# Patient Record
Sex: Male | Born: 1955 | Race: White | Hispanic: No | Marital: Married | Smoking: Current every day smoker
Health system: Southern US, Community
[De-identification: ages and names within clinical notes are randomized; demographics above are authoritative.]

## PROBLEM LIST (undated history)

## (undated) DIAGNOSIS — I10 Essential (primary) hypertension: Secondary | ICD-10-CM

## (undated) DIAGNOSIS — E7800 Pure hypercholesterolemia, unspecified: Secondary | ICD-10-CM

## (undated) DIAGNOSIS — G8929 Other chronic pain: Secondary | ICD-10-CM

## (undated) DIAGNOSIS — G4730 Sleep apnea, unspecified: Secondary | ICD-10-CM

## (undated) DIAGNOSIS — IMO0002 Reserved for concepts with insufficient information to code with codable children: Secondary | ICD-10-CM

## (undated) DIAGNOSIS — M549 Dorsalgia, unspecified: Secondary | ICD-10-CM

## (undated) DIAGNOSIS — G629 Polyneuropathy, unspecified: Secondary | ICD-10-CM

## (undated) DIAGNOSIS — J449 Chronic obstructive pulmonary disease, unspecified: Secondary | ICD-10-CM

## (undated) DIAGNOSIS — J329 Chronic sinusitis, unspecified: Secondary | ICD-10-CM

## (undated) DIAGNOSIS — E119 Type 2 diabetes mellitus without complications: Secondary | ICD-10-CM

## (undated) DIAGNOSIS — M542 Cervicalgia: Secondary | ICD-10-CM

## (undated) DIAGNOSIS — K219 Gastro-esophageal reflux disease without esophagitis: Secondary | ICD-10-CM

## (undated) DIAGNOSIS — G5600 Carpal tunnel syndrome, unspecified upper limb: Secondary | ICD-10-CM

## (undated) DIAGNOSIS — Z9289 Personal history of other medical treatment: Secondary | ICD-10-CM

## (undated) HISTORY — PX: FOOT SURGERY: SHX648

## (undated) HISTORY — PX: HAND SURGERY: SHX662

---

## 2005-03-26 ENCOUNTER — Ambulatory Visit: Admission: RE | Admit: 2005-03-26 | Discharge: 2005-03-26 | Payer: Self-pay | Admitting: Neurosurgery

## 2005-03-30 ENCOUNTER — Ambulatory Visit: Payer: Self-pay | Admitting: Internal Medicine

## 2005-04-15 ENCOUNTER — Ambulatory Visit: Payer: Self-pay | Admitting: Internal Medicine

## 2005-11-22 ENCOUNTER — Ambulatory Visit: Payer: Self-pay | Admitting: Pulmonary Disease

## 2005-12-09 ENCOUNTER — Ambulatory Visit: Payer: Self-pay | Admitting: Pulmonary Disease

## 2005-12-27 ENCOUNTER — Ambulatory Visit: Payer: Self-pay | Admitting: Internal Medicine

## 2006-01-17 ENCOUNTER — Ambulatory Visit: Payer: Self-pay | Admitting: Pulmonary Disease

## 2006-03-01 ENCOUNTER — Ambulatory Visit: Payer: Self-pay | Admitting: Pulmonary Disease

## 2007-01-19 ENCOUNTER — Ambulatory Visit: Payer: Self-pay | Admitting: Pulmonary Disease

## 2007-02-09 ENCOUNTER — Ambulatory Visit: Payer: Self-pay | Admitting: Cardiovascular Disease

## 2007-02-16 ENCOUNTER — Ambulatory Visit: Payer: Self-pay

## 2007-05-01 ENCOUNTER — Emergency Department (HOSPITAL_COMMUNITY): Admission: EM | Admit: 2007-05-01 | Discharge: 2007-05-01 | Payer: Self-pay | Admitting: Podiatry

## 2008-06-16 ENCOUNTER — Ambulatory Visit: Payer: Self-pay | Admitting: *Deleted

## 2008-06-16 ENCOUNTER — Ambulatory Visit: Payer: Self-pay | Admitting: Cardiology

## 2008-06-16 ENCOUNTER — Inpatient Hospital Stay (HOSPITAL_COMMUNITY): Admission: EM | Admit: 2008-06-16 | Discharge: 2008-06-18 | Payer: Self-pay | Admitting: Emergency Medicine

## 2008-06-17 ENCOUNTER — Ambulatory Visit: Payer: Self-pay | Admitting: Surgery

## 2008-06-17 ENCOUNTER — Encounter (INDEPENDENT_AMBULATORY_CARE_PROVIDER_SITE_OTHER): Payer: Self-pay | Admitting: *Deleted

## 2008-06-25 ENCOUNTER — Ambulatory Visit: Payer: Self-pay | Admitting: Pulmonary Disease

## 2008-06-25 DIAGNOSIS — J438 Other emphysema: Secondary | ICD-10-CM | POA: Insufficient documentation

## 2008-06-25 DIAGNOSIS — K219 Gastro-esophageal reflux disease without esophagitis: Secondary | ICD-10-CM

## 2008-06-25 DIAGNOSIS — J4489 Other specified chronic obstructive pulmonary disease: Secondary | ICD-10-CM | POA: Insufficient documentation

## 2008-06-25 DIAGNOSIS — F17200 Nicotine dependence, unspecified, uncomplicated: Secondary | ICD-10-CM | POA: Insufficient documentation

## 2008-06-25 DIAGNOSIS — E785 Hyperlipidemia, unspecified: Secondary | ICD-10-CM | POA: Insufficient documentation

## 2008-06-25 DIAGNOSIS — J449 Chronic obstructive pulmonary disease, unspecified: Secondary | ICD-10-CM

## 2008-07-01 ENCOUNTER — Telehealth: Payer: Self-pay | Admitting: Pulmonary Disease

## 2008-07-02 ENCOUNTER — Telehealth (INDEPENDENT_AMBULATORY_CARE_PROVIDER_SITE_OTHER): Payer: Self-pay | Admitting: *Deleted

## 2008-07-15 ENCOUNTER — Ambulatory Visit (HOSPITAL_COMMUNITY): Admission: RE | Admit: 2008-07-15 | Discharge: 2008-07-15 | Payer: Self-pay | Admitting: Orthopaedic Surgery

## 2008-07-16 ENCOUNTER — Emergency Department (HOSPITAL_COMMUNITY): Admission: EM | Admit: 2008-07-16 | Discharge: 2008-07-16 | Payer: Self-pay | Admitting: Emergency Medicine

## 2008-09-15 ENCOUNTER — Emergency Department (HOSPITAL_COMMUNITY): Admission: EM | Admit: 2008-09-15 | Discharge: 2008-09-15 | Payer: Self-pay | Admitting: Emergency Medicine

## 2008-12-09 ENCOUNTER — Emergency Department (HOSPITAL_COMMUNITY): Admission: EM | Admit: 2008-12-09 | Discharge: 2008-12-09 | Payer: Self-pay | Admitting: Emergency Medicine

## 2009-03-26 ENCOUNTER — Emergency Department (HOSPITAL_COMMUNITY): Admission: EM | Admit: 2009-03-26 | Discharge: 2009-03-26 | Payer: Self-pay | Admitting: Emergency Medicine

## 2009-05-26 IMAGING — CR DG CHEST 2V
2 series · 2 of 2 positions shown · non-contrast
Comparison: PA and lateral chest 07/15/2008 and 01/19/2007.

CLINICAL DATA: Left side chest pain.  Shortness of breath.

CHEST - 2 VIEW

[view not recorded (1 of 2)]
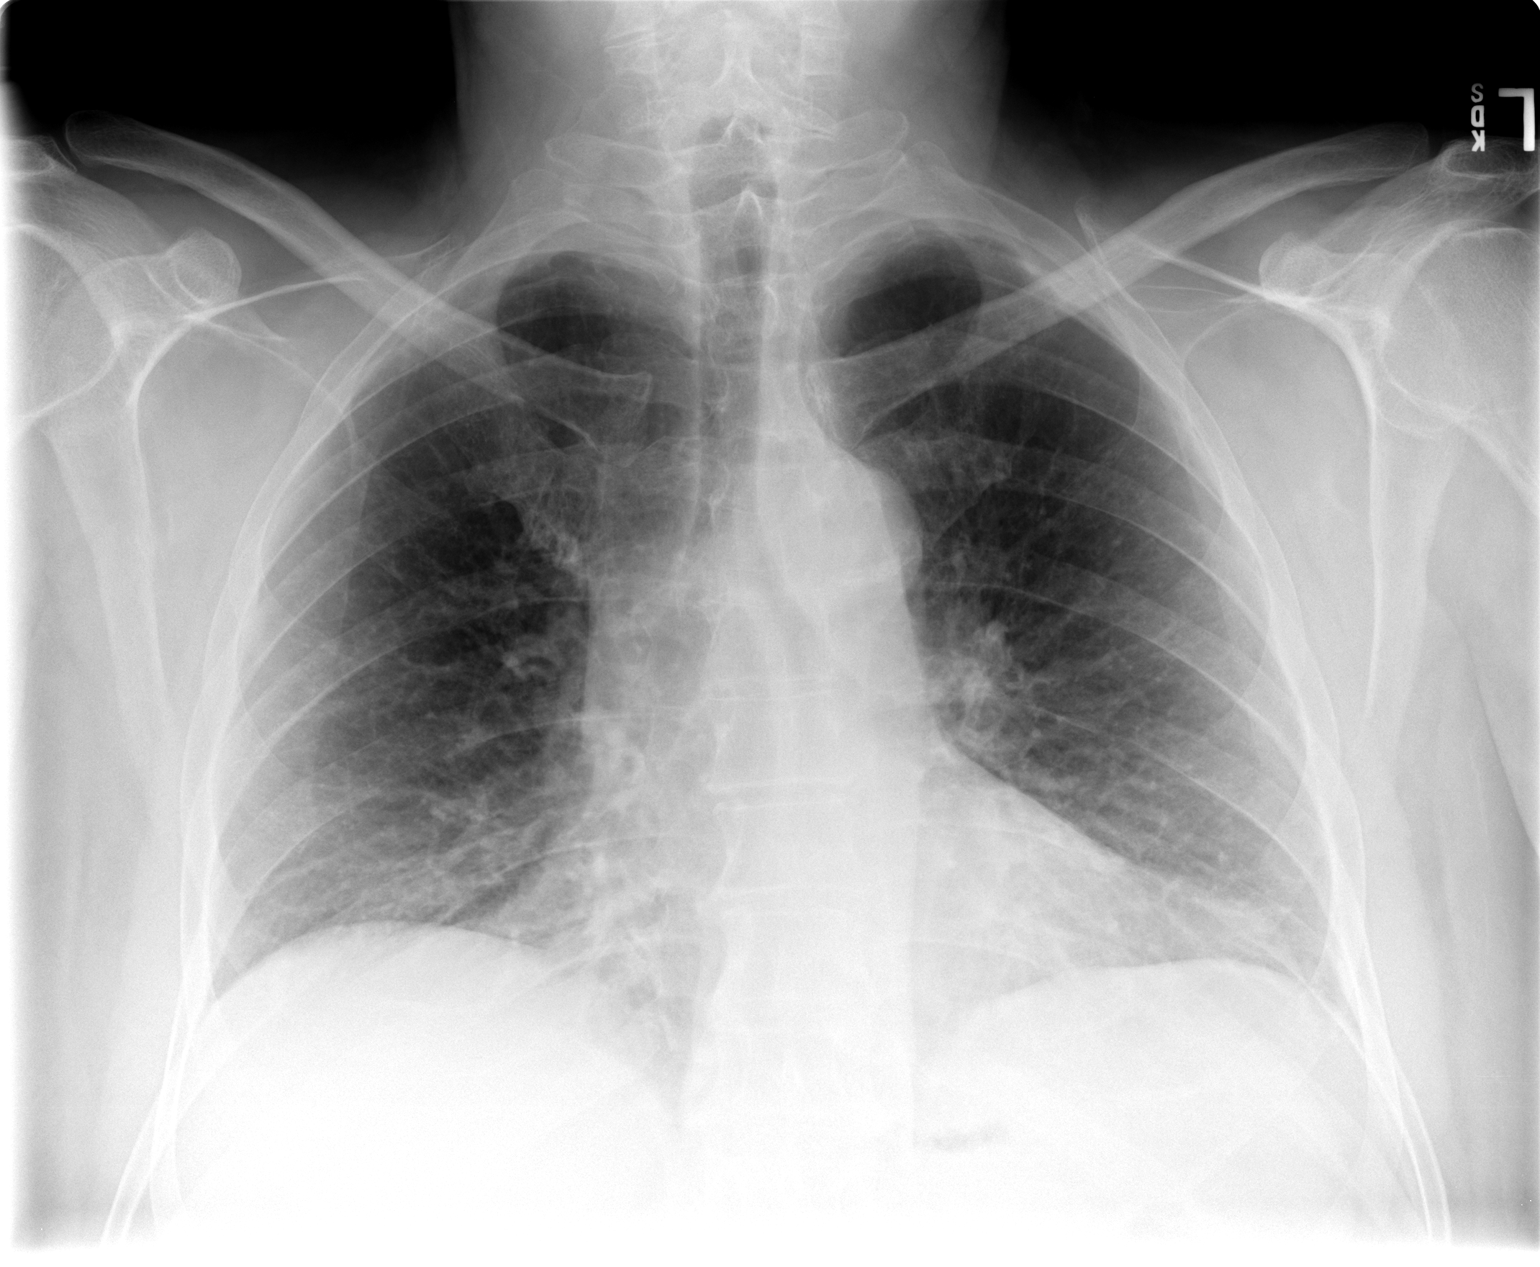

[view not recorded (2 of 2)]
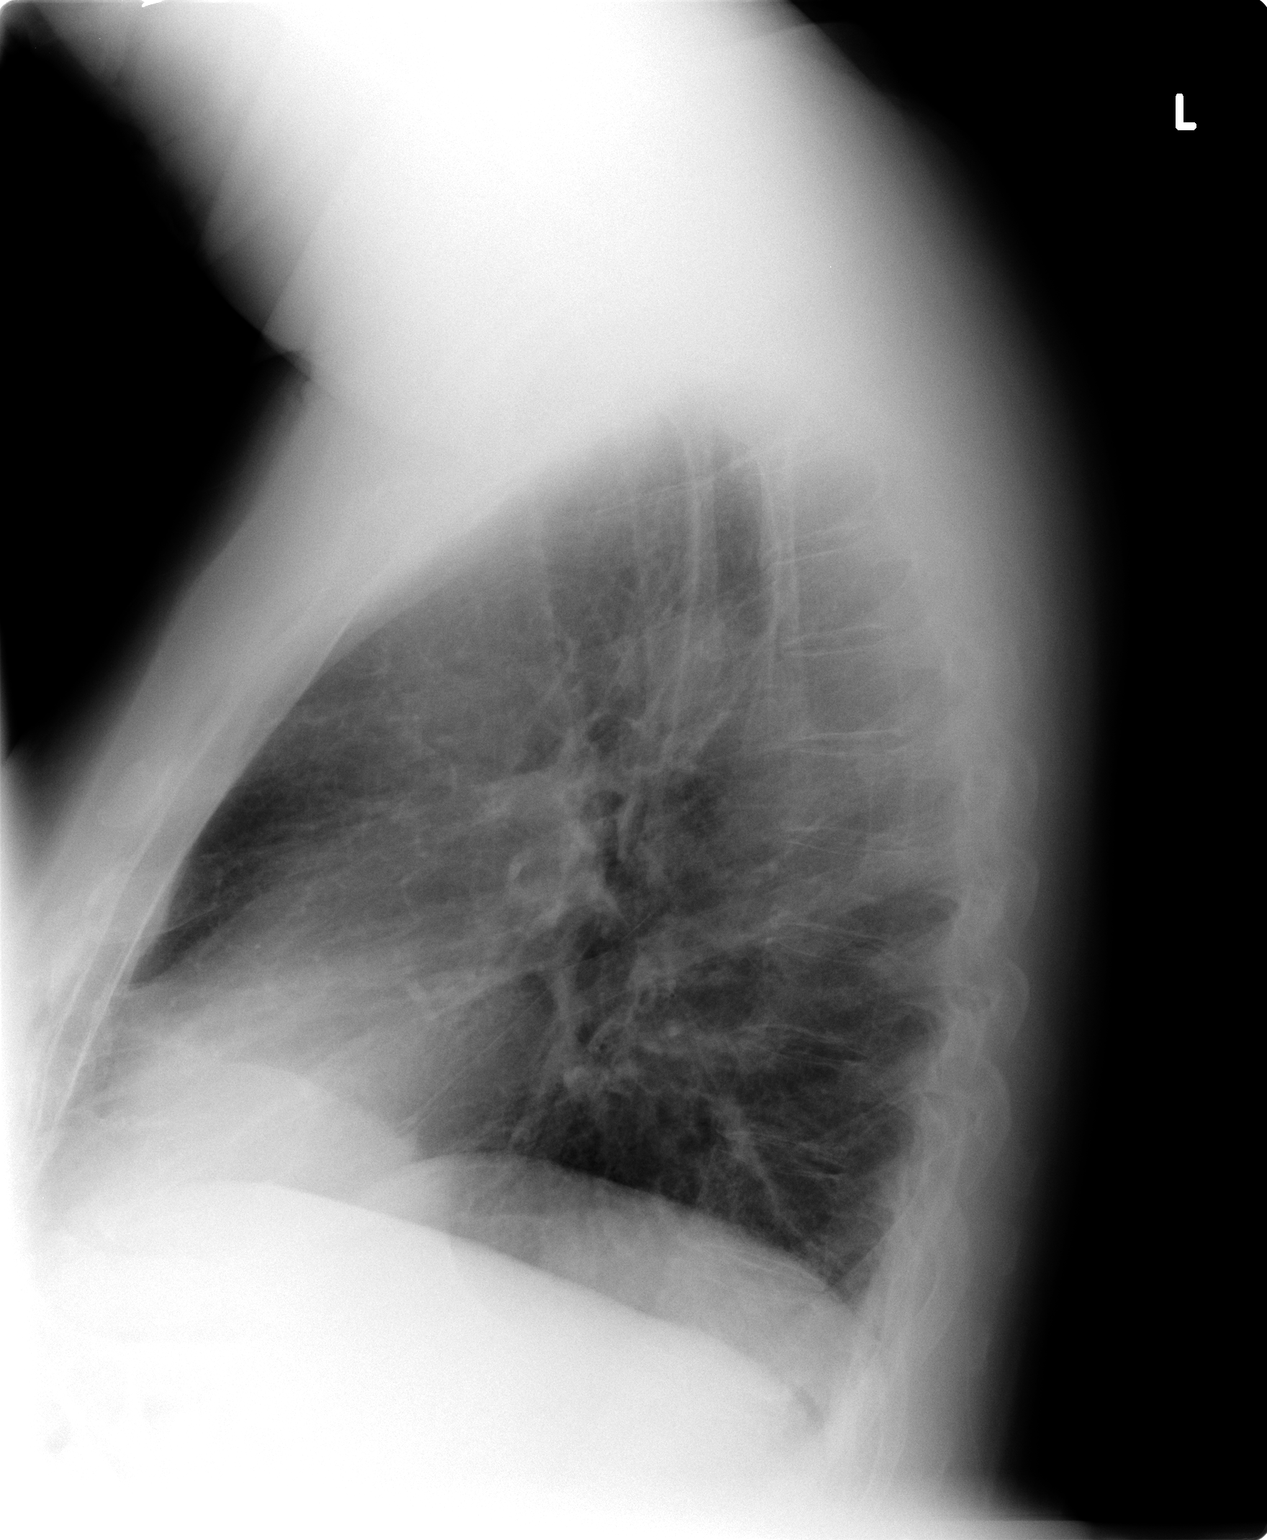

[2 of 2 positions shown; findings below may reference images not displayed]

FINDINGS: Lung volumes are low with crowding of the bronchovascular
structures.  Chronic interstitial coarsening is again noted.  There
is no focal airspace disease or effusion.  Heart size is normal.
IMPRESSION: No acute finding in patient with chronic interstitial change.

## 2009-10-19 IMAGING — CR DG LUMBAR SPINE COMPLETE 4+V
5 series · 5 of 5 positions shown · non-contrast
Comparison: 06/16/2008

CLINICAL DATA: Pain in back

LUMBAR SPINE - COMPLETE 4+ VIEW

[view not recorded (1 of 5)]
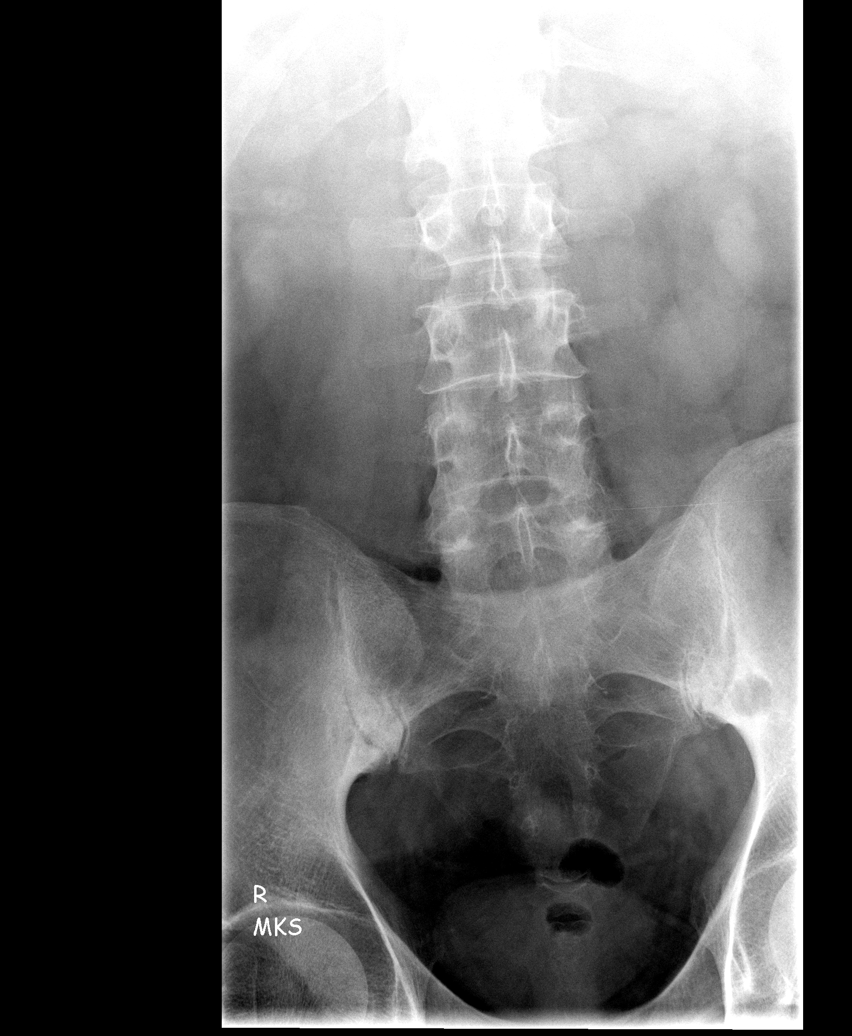

[view not recorded (2 of 5)]
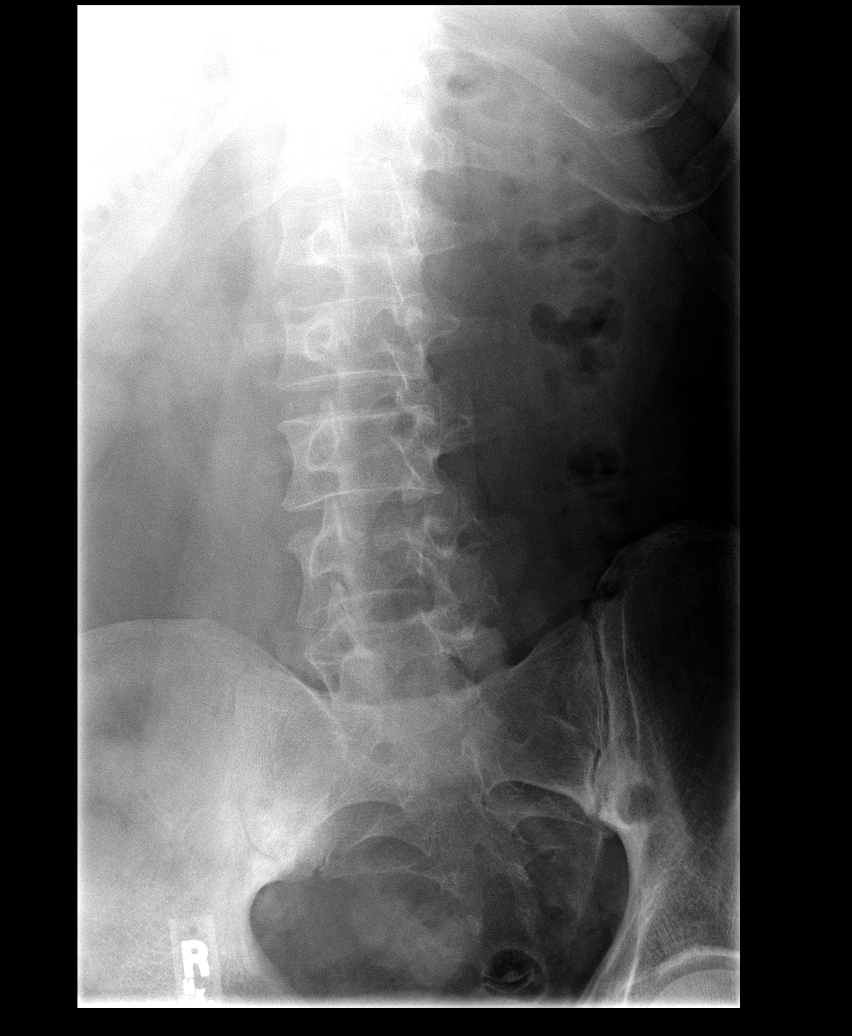

[view not recorded (3 of 5)]
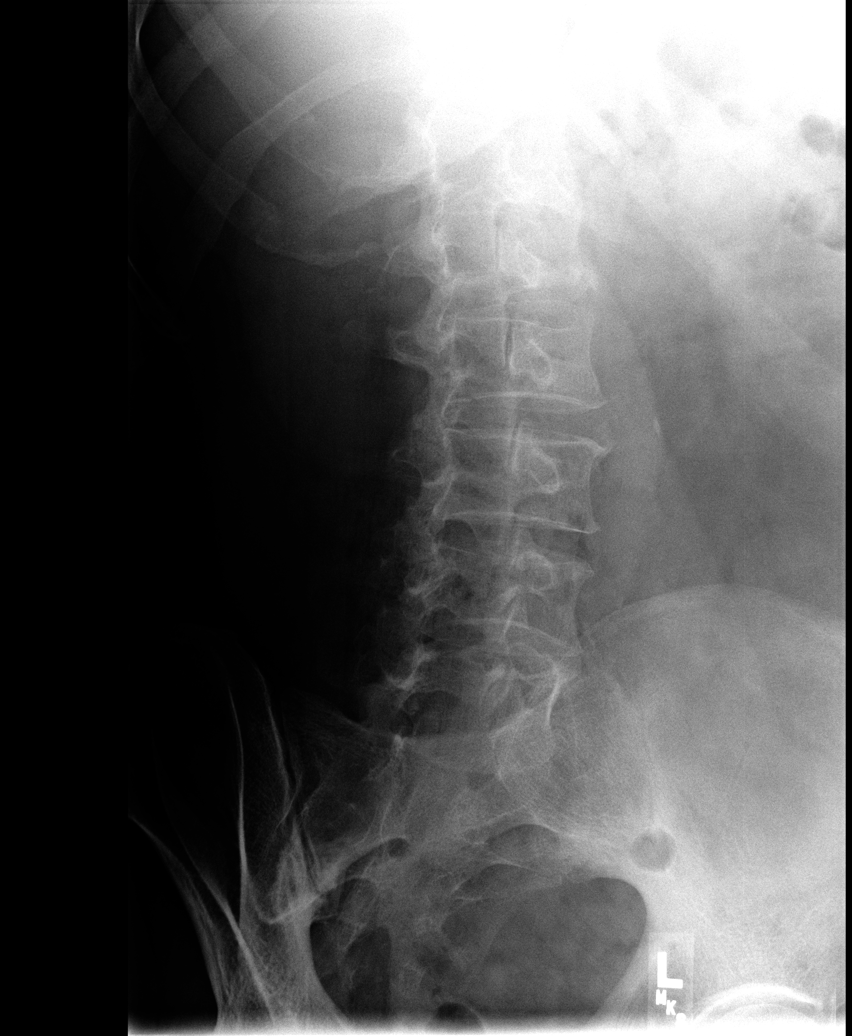

[view not recorded (4 of 5)]
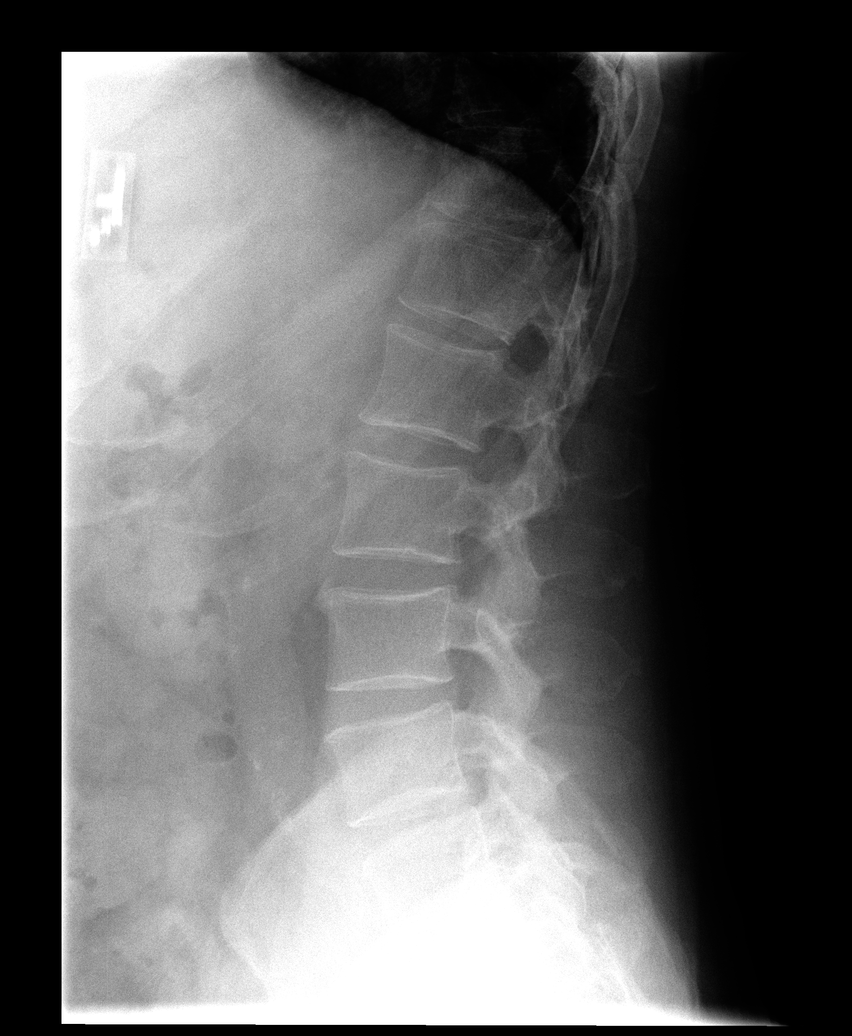

[view not recorded (5 of 5)]
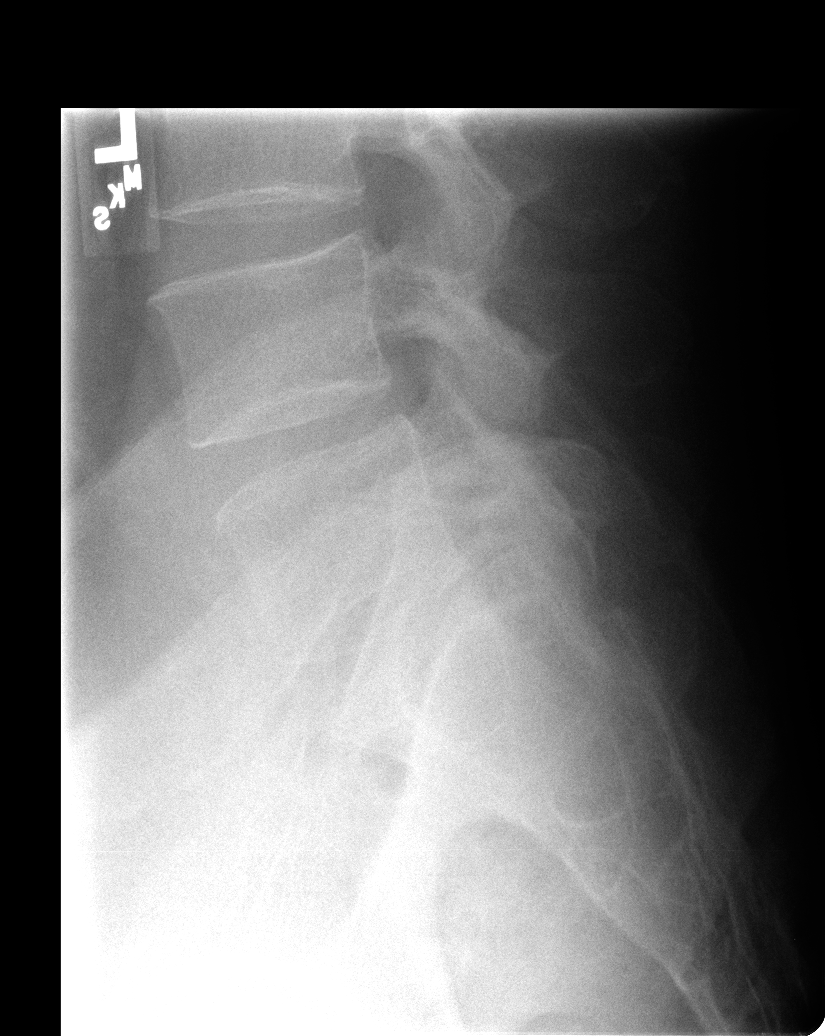

[5 of 5 positions shown; findings below may reference images not displayed]

FINDINGS: There is no evidence of lumbar spine fracture.  Alignment
is normal.  Intervertebral disc spaces are maintained.
IMPRESSION: No acute findings.

## 2009-11-04 DIAGNOSIS — Z9289 Personal history of other medical treatment: Secondary | ICD-10-CM

## 2009-11-04 HISTORY — DX: Personal history of other medical treatment: Z92.89

## 2009-11-17 ENCOUNTER — Encounter (INDEPENDENT_AMBULATORY_CARE_PROVIDER_SITE_OTHER): Payer: Self-pay | Admitting: Internal Medicine

## 2010-08-13 ENCOUNTER — Inpatient Hospital Stay (HOSPITAL_COMMUNITY): Admission: EM | Admit: 2010-08-13 | Discharge: 2009-11-17 | Payer: Self-pay | Admitting: Emergency Medicine

## 2010-11-30 LAB — LIPID PANEL
Cholesterol: 189 mg/dL (ref 0–200)
HDL: 27 mg/dL — ABNORMAL LOW (ref >39)
LDL Cholesterol: 124 mg/dL — ABNORMAL HIGH (ref 0–99)
Triglycerides: 190 mg/dL — ABNORMAL HIGH (ref <150)

## 2010-11-30 LAB — CBC
HCT: 47.3 % (ref 39.0–52.0)
MCHC: 34.3 g/dL (ref 30.0–36.0)
Platelets: 256 10*3/uL (ref 150–400)
RBC: 5.01 MIL/uL (ref 4.22–5.81)
RDW: 13.6 % (ref 11.5–15.5)

## 2010-11-30 LAB — POCT I-STAT, CHEM 8
BUN: 11 mg/dL (ref 6–23)
Calcium, Ion: 0.96 mmol/L — ABNORMAL LOW (ref 1.12–1.32)
Chloride: 110 mEq/L (ref 96–112)
Creatinine, Ser: 0.8 mg/dL (ref 0.4–1.5)
Glucose, Bld: 91 mg/dL (ref 70–99)
HCT: 49 % (ref 39.0–52.0)
Hemoglobin: 16.7 g/dL (ref 13.0–17.0)
Sodium: 140 mEq/L (ref 135–145)

## 2010-11-30 LAB — CARDIAC PANEL(CRET KIN+CKTOT+MB+TROPI)
CK, MB: 0.8 ng/mL (ref 0.3–4.0)
Relative Index: INVALID (ref 0.0–2.5)
Troponin I: 0.01 ng/mL (ref 0.00–0.06)

## 2010-11-30 LAB — DIFFERENTIAL
Eosinophils Absolute: 0.3 10*3/uL (ref 0.0–0.7)
Monocytes Relative: 8 % (ref 3–12)

## 2010-11-30 LAB — POCT CARDIAC MARKERS

## 2010-11-30 LAB — BRAIN NATRIURETIC PEPTIDE: Pro B Natriuretic peptide (BNP): <30 pg/mL (ref 0.0–100.0)

## 2010-12-13 LAB — ETHANOL: Alcohol, Ethyl (B): <5 mg/dL (ref 0–10)

## 2010-12-13 LAB — URINALYSIS, ROUTINE W REFLEX MICROSCOPIC
Glucose, UA: NEGATIVE mg/dL
Hgb urine dipstick: NEGATIVE
Protein, ur: NEGATIVE mg/dL
Specific Gravity, Urine: 1.025 (ref 1.005–1.030)
pH: 6 (ref 5.0–8.0)

## 2010-12-13 LAB — COMPREHENSIVE METABOLIC PANEL
ALT: 15 U/L (ref 0–53)
Albumin: 3.4 g/dL — ABNORMAL LOW (ref 3.5–5.2)
Alkaline Phosphatase: 80 U/L (ref 39–117)
BUN: 13 mg/dL (ref 6–23)
Calcium: 9.1 mg/dL (ref 8.4–10.5)
Potassium: 3.8 mEq/L (ref 3.5–5.1)
Sodium: 141 mEq/L (ref 135–145)
Total Protein: 6.6 g/dL (ref 6.0–8.3)

## 2010-12-13 LAB — DIFFERENTIAL
Basophils Relative: 1 % (ref 0–1)
Lymphs Abs: 3.5 10*3/uL (ref 0.7–4.0)
Monocytes Absolute: 0.8 10*3/uL (ref 0.1–1.0)
Monocytes Relative: 6 % (ref 3–12)
Neutro Abs: 8.8 10*3/uL — ABNORMAL HIGH (ref 1.7–7.7)

## 2010-12-13 LAB — CBC
Platelets: 236 10*3/uL (ref 150–400)
RDW: 13.5 % (ref 11.5–15.5)

## 2010-12-21 LAB — POCT CARDIAC MARKERS
CKMB, poc: <1 ng/mL — ABNORMAL LOW (ref 1.0–8.0)
Myoglobin, poc: 63.7 ng/mL (ref 12–200)
Troponin i, poc: <0.05 ng/mL (ref 0.00–0.09)

## 2011-01-19 NOTE — Consult Note (Signed)
Terry Andersen, Terry Andersen   MEDICAL RECORD NO.:  [id number]          PATIENT TYPE:  INP   LOCATION:  3743                         FACILITY:  [facility]   PHYSICIAN:  Zola Button T. Lazarus Salines, M.D. DATE OF BIRTH:  May 12, 1956   DATE OF CONSULTATION:  06/16/2008  DATE OF DISCHARGE:                                 CONSULTATION   CHIEF COMPLAINT:  Facial trauma.   HISTORY:  A 55 year old white male likes to sleep flat on the hard  concrete of his front porch because he has very significant and painful  neck arthritis.  Somewhere around midnight, he attempted to get up and  fell.  His wife discovered him some time later.  He is quite insistent  that he did not lose consciousness.  He had some sort of x-rays with  some like cervical flexion/extension films this week, which made his  neck pain very much worse.  He has been nauseated for several days.  He  is not sure if this contributed to the fall or not.  In any event, he  was brought into the emergency room.  He has pain in the left cheek  region.  No vision problems.  No active bleeding.  Mental status seems  intact.  No prior history of trauma to his nose or face.  He does have a  history of chronic sinusitis.  A CT scan was done, which showed a  minimally displaced left trimalleolar zygomatic fracture with some fluid  in the maxillary sinus.  There is a fracture in the orbital floor  nondisplaced.  The entire bone is minimally posterior displaced and  slightly counter clockwise rotated at the frontal zygomatic suture line.  The arch is not particularly depressed, and the anterior face of the  maxilla is in good location.  He has some diffuse mucosal thickening  throughout the sinuses consistent with chronic sinusitis.  No other  evidence of trauma.   ALLERGIES:  He is allergic to ASPIRIN.   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  He has a left rotator cuff injury,  which has not been  repaired reportedly owing to increased anesthetic risk.   SOCIAL HISTORY:  He is married.  He stopped smoking.  He does not drink.   FAMILY HISTORY:  Noncontributory.   REVIEW OF SYSTEMS:  Noncontributory.   PHYSICAL EXAMINATION:  GENERAL:  This is a large-framed, middle-aged  white male, who is sitting upright.  NEUROLOGIC:  Mental status is basically intact.  He hears well in  conversational speech.  Voice is clear and respirations unlabored  through the nose.  HEENT:  The head is specifically traumatic in the left zygoma region  with minimal swelling, but with some ecchymosis.  On palpation of the  bony facial contours, he is slightly tender at the infraorbital rim in  the front zygomatic suture with no palpable depression.  Both ear canals  have wax impactions.  The internal nose is moist and slightly  corrugated.  He has intact vision in each eye  with full range of motion  and no subjective pain or diplopia.  Oral cavity is edentulous without  lesions.  Oropharynx is fleshy.  NECK:  Without adenopathy.   IMPRESSION:  1. Minimally displaced left zygoma fracture.  2. Low-grade chronic sinusitis.  3. Bilateral cerumen impactions.  4. Multiple medical complicating factors with self-reported high      anesthesia risk (chronic obstructive pulmonary disease?, cardiac      enlargement, obesity).  5. The patient denies syncope.   PLAN:  Routine ice, elevation, and analgesia.  I will cover him with  amoxicillin for 2 weeks 500 mg t.i.d. as prophylaxis for his fracture,  but also for his chronic sinusitis.  He needs to have visual checks  along with his vital signs while he is in the hospital.  He needs to  avoid blowing his nose for 2 full weeks.  He does need a formal  ophthalmologic evaluation some time in the next coming week.  I will see  him back in my office in 8 days.  Given his supposed anesthesia risks,  and his minimal displacement, this may be  something that we can manage  conservatively.  He and his wife understand and agree with the  discussion and plans.      Gloris Manchester. Lazarus Salines, M.D.  Electronically Signed     KTW/MEDQ  D:  06/16/2008  T:  06/16/2008  Job:  [id number]

## 2011-01-19 NOTE — Letter (Signed)
February 09, 2007    Danice Goltz, MD  [address] [place], [place] [address]   RE:  Terry Andersen, Terry Andersen  MRN:  [id number]  /  DOB:  11-15-1955   Dear Terry Andersen,   I saw Terry Andersen as an outpatient at the [facility] office on  February 09, 2007.  As you know he is a 55 year old gentleman who you  recently saw for pulmonary evaluation prior to shoulder surgery.  He has  upcoming open shoulder surgery planned for a rotator cuff tear by Dr.  Jerl Santos.  It is my understanding that when he underwent ambulatory  oximetry he had abnormality high heart rates, up to the 150s.  This was  with just normal walking.  He was subsequently referred for pre-op  cardiac evaluation.   Terry Andersen denies any palpitations.  He does have some lightheaded spells  but no syncope.  He admits to exertional chest pressure and tightness  that occurs fairly regularly.  His symptoms are not new in nature and  they have been unchanged for quite some time.  They seem to be more  noticeable with brisk walking.  He has no documented history of cardiac  disease.  He has been told that he has an enlarged heart at some point  in the past.  I suspect this was from a chest x-ray.  Of note he has  been able to intentionally lose 70 pounds over the last 6 months by  dietary modification and frequent walking.  He does feel better since he  has lost this weight.  He has no other cardiac complaints at this time.  He specifically denies orthopnea, PND, edema, or claudication symptoms.   MEDICATIONS:  1. Advair 250/50 mcg twice daily.  2. Oxycodone 5 mg as needed.  3. Oxygen which is used only occasionally.   PAST MEDICAL HISTORY:  1. COPD.  2. Gastroesophageal reflux disease.  3. Left hand surgery back in 1985.  4. Left foot surgery in 1989.   He has had no other hospitalizations or medical problems.   FAMILY HISTORY:  His father died at age 69 of myocardial infarction.  He  has siblings but they have not had coronary artery  disease and his  mother did not have coronary artery disease.  He has a paternal uncle  who died in his 68s of a myocardial infarction.   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  He smokes 2 packs of cigarettes daily  for the past 38 years.  He used to drink alcohol but has not had any  alcohol in several years.  He has never used illicit drugs.  He is  married with 3 children.   REVIEW OF SYSTEMS:  Complete 12-point review of systems was performed,  pertinent positives included reflux and shortness of breath.  All other  systems were reviewed and are negative except as detailed.   PHYSICAL EXAMINATION:  The patient is alert and oriented, he is in no  acute distress.  He is well-developed and well-nourished.  Weight is 219  pounds, blood pressure is 110/80 in both arms, heart rate is 81,  respiratory rate 16.  HEENT:  Normal with the exception of poor dentition.  NECK:  Normal carotid upstrokes without bruits.  Jugular venous pressure  is normal.  There is no thyromegaly or thyroid nodules.  LUNGS:  Clear bilaterally with a prolonged expiratory phase.  CARDIAC:  The apex is discrete and nondisplaced.  The heart is regular  rate and rhythm without murmurs or gallops.  There is no right  ventricular heave or lift.  ABDOMEN:  Soft, nontender, no organomegaly, no abdominal bruits, no  masses.  BACK:  There is no flank tenderness.  EXTREMITIES:  There is no clubbing, cyanosis, or edema.  Peripheral  pulses are 2+ and equal throughout.  SKIN:  Warm and dry without rash.  NEUROLOGIC:  Cranial nerves II-XII are intact, strength 5/5 and equal in  the arms and legs bilaterally.  LYMPHATICS:  There is no adenopathy.   EKG shows normal sinus rhythm and is within normal limits.   ASSESSMENT:  Terry Andersen is a 55 year old gentleman with underlying  cardiac risk factors including family history of coronary artery  disease, and  longstanding tobacco abuse.  He falls in the range of  intermediate risk of cardiac complications from upcoming shoulder  surgery.  He needs better risk stratification and I think an exercise  Myoview scan is warranted in the setting of his risk factors with  exertional chest pressure.  I am concerned about his symptoms and have  reasonable degree of suspicion that he has some underlying coronary  artery disease.  It is also possible that his symptoms are due to his  lung disease, but I think this needs to be better elucidated with a  stress test.  I reviewed this in detail with the patient and we will  schedule him for an exercise Myoview scan.  Pending the results of the  scan he will either be able to proceed with surgery or will require  cardiac catheterization.  If he has high risk features on this scan I  would not hesitate to perform a cardiac cath.   In the meantime I advised him to continue with aspirin which he has  recently started.  I did not recommend any other changes to his medical  regimen today.  We will be in contact with Terry Andersen after the results  of his stress study are complete.   Terry Andersen, thanks again for allowing me to see Terry Andersen.  Please feel free  to call at any time with questions regarding his care.    Sincerely,      Veverly Fells. Excell Seltzer, MD  Electronically Signed    MDC/MedQ  DD: 02/09/2007  DT: 02/09/2007  Job #: [id number]   CC:    Lubertha Basque. Jerl Santos, M.D.

## 2011-01-19 NOTE — Discharge Summary (Signed)
NAMEHAIDYN, CHADDERDON NO.:  [id number]   MEDICAL RECORD NO.:  [id number]          PATIENT TYPE:  INP   LOCATION:  3743                         FACILITY:  [facility]   PHYSICIAN:  Manning Charity, MD     DATE OF BIRTH:  [birth year]   DATE OF ADMISSION:  06/16/2008  DATE OF DISCHARGE:  06/18/2008                               DISCHARGE SUMMARY   DISCHARGE DIAGNOSES:  1. Left maxillary facial fracture  2. Chronic sinusitis of the right sphenoid, bilateral ethmoid, and      left maxillary sinuses.  3. Syncope.  4. Chronic neck pain.  5. Chronic obstructive pulmonary disease.  6. Gastroesophageal reflux disease.  7. Bilateral carpal tunnel syndrome.  8. Sinus bradycardia, asymptomatic.   DISCHARGE MEDICATIONS:  1. Advair 250/50 mcg 1 puff twice each day.  2. Oxycodone 15 mg p.o. q.i.d. p.r.n. pain.  3. Zegerid 40 mg p.o. daily.  4. Albuterol 1-2 puffs p.r.n. shortness of breath.  5. Amoxicillin 500 mg p.o. q.8 h. for 12 days (prescription given).  6. Zocor 20 mg p.o. nightly (prescription given).   DISCHARGE CONDITION AND FOLLOWUP:  The patient was discharged in stable  condition.  He had no syncopal events while in the hospital.  His workup  for cardiac causes of possible syncope was unrevealing.  He was placed  on amoxicillin as prophylaxis against any infection due to his facial  fracture as well as to treat his sinusitis.  He is to follow up on  June 18, 2008, at 1:20 p.m. with his local ophthalmologist.  He is to  follow up with his PCP, Dr. Dyann Ruddle sometime within the next month.   PROCEDURES PERFORMED:  X-ray of the lumbar spine dated June 16, 2008,  showed no acute findings with bilateral sacroiliitis noted incidentally.  CT of the head without contrast dated June 16, 2008, found a left  facial tripod fracture, which involved the anterior and lateral walls of  the maxillary sinus, the left orbital floor, the inferior orbital rim,  the left lateral  orbital wall, and the left zygomatic arch.  The globes  under intrathyroidal anatomy were normal in appearance.  Chronic  sinusitis was seen involving the right sphenoid, bilateral ethmoid, and  left maxillary sinuses.  A 2-D echocardiogram dated June 17, 2008,  showed left ventricular ejection fraction 55% with no left ventricular  regional wall motion abnormalities.   CONSULTATIONS:  Dr. Flo Shanks of ENT was consulted.   BRIEF ADMITTING HISTORY AND PHYSICAL:  Mr. Castille is a 55 year old man  who was sleeping outdoors on a concrete porch due to chronic neck pain  and he stood up too quickly, lost his balance, and the left side of his  face struck a concrete post.  Initially, he denied dizziness, although  later he thought he might have been dizzy prior to his injury.  He  denied chest pain, shortness of breath, palpitations, or loss of  consciousness.  He was found by his wife lying on the ground moaning  with pain and was brought to the emergency department.  He did have  vomiting all week, which he says is very common every time he has an  exacerbation in his neck pain.   Vital signs, temperature 98.4, blood pressure 124/70, pulse 60,  respirations 20, and oxygen saturation 96% on room air.  In general, he  was in no acute distress.  His tympanic membranes were occluded by dark  cerumen bilaterally.  Mucous membranes were moist.  He had normal  respiratory effort and scattered expiratory wheezes with diffusely  decreased breath sounds.  His heart sounds were distant with regular  rate and rhythm.  No murmur.  His abdomen was obese, soft, nontender,  and nondistended.  Extremities had no edema.  There is an abrasion over  the left cheek.  Neuro, extraocular movements were intact.  His strength  was 5/5 in all extremities and the neuro exam was nonfocal.   LABORATORY DATA:  Sodium 141, potassium 3.9, chloride 106, bicarb 25,  BUN 12, creatinine 0.8, and glucose 135.  Urinalysis  was negative.  BNP  was 43.  Alcohol was less than 5.  EKG showed normal sinus rhythm with a  rate of 65 and no acute changes.  CAT scan of the head was obtained and  the findings given above.   HOSPITAL COURSE:  1. Left maxillary facial fracture:  The patient was evaluated by Dr.      Lazarus Salines of ENT who recommended serial vision checks as well as      starting amoxicillin.  The patient was instructed not to blow his      nose for 2 weeks.  He also was asked to make an appointment with an      ophthalmologist, which he did for later in the afternoon on the day      of discharge.  He was treated with IV morphine for pain and then      transitioned to p.o. medications.  He will complete 12 days of      amoxicillin as an outpatient for a total of 14 days.  2. Possible syncope:  It was very difficult to determine based on the      history of his unwitnessed injury whether in fact he had a syncopal      event or whether he had lost his balance due to pain from turning      his head and exacerbating his neck pain as he says it does happen.      He was ruled out for acute myocardial infarction with serial EKGs      and cardiac enzymes.  He was noted, however, to have sinus      bradycardia with a rate in the 40s on telemetry as well as several      EKGs.  Several of these were obtained while he was resting after      being given pain medication, however.  A 2-D echo was obtained with      findings as outlined above and was essentially normal.   DISCHARGE LABORATORY DATA AND VITAL SIGNS:  Temperature 98.5, pulse 68,  respirations 20, blood pressure 148/85, and oxygen saturation 94% on  room air.  White blood count 11.5, hemoglobin 14.2, and platelet count  207.  Sodium 141, potassium 4.0, chloride 108, bicarb 26, glucose 105,  BUN 12, creatinine 0.77, and calcium 8.5.  Blood cultures obtained  during hospitalization were negative x5 days.  TSH was 0.371, total  cholesterol 152, triglycerides  101, HDL 34, LDL 98, and VLDL 20.  Loel Dubonnet, MD  Electronically Signed      Manning Charity, MD  Electronically Signed    PN/MEDQ  D:  06/24/2008  T:  06/25/2008  Job:  [id number]   cc:   Prescott Parma

## 2011-01-19 NOTE — Assessment & Plan Note (Signed)
[facility]                             PULMONARY OFFICE NOTE   OGLE, HOEFFNER                         MRN:          [id number]  DATE:01/19/2007                            DOB:          [birth year]    EXTENDED OFFICE VISIT FOR THE PURPOSE OF PREOPERATIVE EVALUATION   The patient presents today for a preoperative evaluation for the purpose  of shoulder surgery. The patient is being evaluated by Dr.  Marcene Corning of Orthopedics for left shoulder impingement syndrome. He also  has probable small rotator cuff tear. The patient needs surgery for  correction of this. The patient was last seen by [place] in June of 2007.  After that, he failed to show up for followup. Since then, he has lost a  tremendous amount of weight. He currently weighs 222 pounds. During his  last visit he weighed 260 pounds so he has made great advance in this  regard. He continues to smoke approximately a pack of cigarettes per  day, but this is down from four packs of cigarettes per day that he used  to smoke. He has been out of his Advair and Spiriva for the last month.  He has issues with dyspnea on exertion, but for the most part does well  and has no wheezes. He feels that he has done much better since his  weight loss. His only complaint is that of noticing that his blood  pressure is sometimes low. Apparently his wife does have a blood  pressure cuff and he monitors this. Occasionally he feels symptomatic  with this. He denies any chest pain.   CURRENT MEDICATIONS:  Are as noted in the intake sheet. These include  only Oxy IR. He is really out of all of his other medications to include  Protonix, Advair and Spiriva.   PHYSICAL EXAMINATION:  Blood pressure was 96/62, pulse 88, oxygen  saturation of 93%, weight of 222 pounds. Temperature 97.6.  In general, this is a well-developed, somewhat mildly obese gentleman  who is in no acute distress.  HEENT: Examination is  remarkable for poor dentition.  NECK: Supple. No adenopathy noted. No JVD.  LUNGS:  Clear to auscultation bilaterally.  CARDIAC: Regular rate and rhythm. No rubs, murmurs or gallops.  LOWER EXTREMITIES: The patient has no cyanosis, clubbing and no edema  noted.   We did perform spirometry today which shows FEV1 of 3.11 liters or 65%  of predicted with an FEV1/FVC ratio of 67% predicted. This is actually  markedly improved from his spirometry exactly a year ago and his  improvement is by 12% from his FEV1.   We did perform chest x-ray today which shows a generous cardiac  silhouette and chronic obstructive lung disease changes, but no acute  issue.   We did perform an ambulatory oximetry today. The patient performed well  regards to oxygen saturation. Never desaturated in past, 94%. However,  he did burst into an episode where his heart rate did go up to as high  as 155 transiently and then fell back to his normal between  90 and 116.   IMPRESSION:  1. Chronic obstructive pulmonary disease on the basis of chronic      bronchitis with asthmatic bronchitic component. The patient appears      to be relatively well-compensated, but this could be optimized      somewhat. He is however in his current condition a moderate risk      for potential complications from rotator cuff repair. His risk      would be further decreased with cessation of smoking.  2. Coronary artery disease. I am concerned that the patient may need      clearance in this regard prior to surgery and this was expressed to      Mr. Carolyne Fiscal.  3. History of gastroesophageal reflux disease which apparently is      quiescent now that the patient has engaged in weight loss.   PLAN:  1. I have recommended that the patient resume Advair 250/50 one      inhalation twice a day. Rinse his mouth well after use. The patient      was explained all of this.  2. The patient also was counseled extensively with regards to his       smoking cessation and provided samples of Commit lozenges to try      attempt discontinue smoking.  3. I am concerned that the patient may need cardiac clearance prior to      surgery. This was expressed to the patient. The patient apparently      is familiar with Dr.  Andee Lineman who is now at [facility] office. It      would be reasonable if Mr. Saladin could see either Dr.  Andee Lineman or      Dr.  Nicholes Mango at the [facility] office in      [place].  4. Followup will be in this office in 6 to 8 weeks time. He will be      assigned a new pulmonary physician at that time as I will be      leaving the practice.     Gailen Shelter, MD     CLG/MedQ  DD: 01/19/2007  DT: 01/19/2007  Job #: [id number]   cc:   Lubertha Basque. Jerl Santos, M.D.  Vena Rua, RN

## 2011-06-07 LAB — CBC
Hemoglobin: 14.1
MCHC: 33.4
MCHC: 33.5
MCHC: 33.6
MCV: 98
MCV: 98.8
Platelets: 207
Platelets: 213
RBC: 4.23
RBC: 4.29
RDW: 13.7
RDW: 13.8

## 2011-06-07 LAB — URINALYSIS, ROUTINE W REFLEX MICROSCOPIC
Nitrite: NEGATIVE
Specific Gravity, Urine: 1.02
Urobilinogen, UA: 0.2
pH: 7.5

## 2011-06-07 LAB — BASIC METABOLIC PANEL
BUN: 11
CO2: 25
CO2: 26
Calcium: 8.7
Chloride: 108
Creatinine, Ser: 0.66
GFR calc Af Amer: >60
Glucose, Bld: 111 — ABNORMAL HIGH
Sodium: 141
Sodium: 142

## 2011-06-07 LAB — CARDIAC PANEL(CRET KIN+CKTOT+MB+TROPI)
CK, MB: 1.9
Relative Index: 1.6
Total CK: 108
Total CK: 142
Troponin I: 0.01
Troponin I: 0.02

## 2011-06-07 LAB — CULTURE, BLOOD (ROUTINE X 2)
Culture: NO GROWTH
Culture: NO GROWTH

## 2011-06-07 LAB — DIFFERENTIAL
Eosinophils Absolute: 0
Eosinophils Relative: 0
Lymphocytes Relative: 11 — ABNORMAL LOW
Lymphs Abs: 1.5
Monocytes Relative: 5

## 2011-06-07 LAB — POCT CARDIAC MARKERS
CKMB, poc: 1.2
Troponin i, poc: <0.05

## 2011-06-07 LAB — URINE MICROSCOPIC-ADD ON

## 2011-06-07 LAB — MAGNESIUM: Magnesium: 2.1

## 2011-06-07 LAB — CK TOTAL AND CKMB (NOT AT ARMC)
CK, MB: 1.7
Relative Index: 1.1
Total CK: 160

## 2011-06-07 LAB — POCT I-STAT, CHEM 8
BUN: 13
Creatinine, Ser: 0.9
Glucose, Bld: 138 — ABNORMAL HIGH
Hemoglobin: 16.7
Potassium: 3.9
Sodium: 141
TCO2: 23

## 2011-06-07 LAB — LIPID PANEL
Cholesterol: 152
HDL: 34 — ABNORMAL LOW
LDL Cholesterol: 98
Total CHOL/HDL Ratio: 4.5

## 2011-06-07 LAB — COMPREHENSIVE METABOLIC PANEL
ALT: 22
AST: 26
Calcium: 8.5
Creatinine, Ser: 0.863
GFR calc Af Amer: >60
GFR calc non Af Amer: >60
Sodium: 141
Total Protein: 6.7

## 2011-06-07 LAB — ETHANOL: Alcohol, Ethyl (B): <5

## 2011-06-08 LAB — BASIC METABOLIC PANEL
CO2: 23
Calcium: 9.2
GFR calc Af Amer: >60
GFR calc non Af Amer: >60
Sodium: 144

## 2011-06-08 LAB — URINALYSIS, ROUTINE W REFLEX MICROSCOPIC
Glucose, UA: NEGATIVE
Hgb urine dipstick: NEGATIVE
Specific Gravity, Urine: >1.03 — ABNORMAL HIGH
pH: 5.5

## 2011-06-08 LAB — CBC
Hemoglobin: 16.4
RBC: 4.9
WBC: 11.9 — ABNORMAL HIGH

## 2013-08-08 ENCOUNTER — Emergency Department (HOSPITAL_COMMUNITY)
Admission: EM | Admit: 2013-08-08 | Discharge: 2013-08-08 | Disposition: A | Discharge status: Home / Self Care | Payer: Medicare Other | Attending: Emergency Medicine | Admitting: Emergency Medicine

## 2013-08-08 ENCOUNTER — Encounter (HOSPITAL_COMMUNITY): Payer: Self-pay | Admitting: Emergency Medicine

## 2013-08-08 ENCOUNTER — Emergency Department (HOSPITAL_COMMUNITY): Payer: Medicare Other

## 2013-08-08 DIAGNOSIS — Z8739 Personal history of other diseases of the musculoskeletal system and connective tissue: Secondary | ICD-10-CM | POA: Insufficient documentation

## 2013-08-08 DIAGNOSIS — I10 Essential (primary) hypertension: Secondary | ICD-10-CM | POA: Insufficient documentation

## 2013-08-08 DIAGNOSIS — Z7982 Long term (current) use of aspirin: Secondary | ICD-10-CM | POA: Insufficient documentation

## 2013-08-08 DIAGNOSIS — E119 Type 2 diabetes mellitus without complications: Secondary | ICD-10-CM | POA: Insufficient documentation

## 2013-08-08 DIAGNOSIS — Z9189 Other specified personal risk factors, not elsewhere classified: Secondary | ICD-10-CM | POA: Insufficient documentation

## 2013-08-08 DIAGNOSIS — J449 Chronic obstructive pulmonary disease, unspecified: Secondary | ICD-10-CM | POA: Insufficient documentation

## 2013-08-08 DIAGNOSIS — R071 Chest pain on breathing: Secondary | ICD-10-CM | POA: Insufficient documentation

## 2013-08-08 DIAGNOSIS — J4489 Other specified chronic obstructive pulmonary disease: Secondary | ICD-10-CM | POA: Insufficient documentation

## 2013-08-08 DIAGNOSIS — F17200 Nicotine dependence, unspecified, uncomplicated: Secondary | ICD-10-CM | POA: Insufficient documentation

## 2013-08-08 DIAGNOSIS — E7800 Pure hypercholesterolemia, unspecified: Secondary | ICD-10-CM | POA: Insufficient documentation

## 2013-08-08 DIAGNOSIS — Z79899 Other long term (current) drug therapy: Secondary | ICD-10-CM | POA: Insufficient documentation

## 2013-08-08 DIAGNOSIS — R0789 Other chest pain: Secondary | ICD-10-CM

## 2013-08-08 DIAGNOSIS — G8929 Other chronic pain: Secondary | ICD-10-CM | POA: Insufficient documentation

## 2013-08-08 DIAGNOSIS — R05 Cough: Secondary | ICD-10-CM

## 2013-08-08 DIAGNOSIS — R059 Cough, unspecified: Secondary | ICD-10-CM | POA: Insufficient documentation

## 2013-08-08 DIAGNOSIS — Z8719 Personal history of other diseases of the digestive system: Secondary | ICD-10-CM | POA: Insufficient documentation

## 2013-08-08 HISTORY — DX: Dorsalgia, unspecified: M54.9

## 2013-08-08 HISTORY — DX: Sleep apnea, unspecified: G47.30

## 2013-08-08 HISTORY — DX: Chronic sinusitis, unspecified: J32.9

## 2013-08-08 HISTORY — DX: Gastro-esophageal reflux disease without esophagitis: K21.9

## 2013-08-08 HISTORY — DX: Chronic obstructive pulmonary disease, unspecified: J44.9

## 2013-08-08 HISTORY — DX: Pure hypercholesterolemia, unspecified: E78.00

## 2013-08-08 HISTORY — DX: Personal history of other medical treatment: Z92.89

## 2013-08-08 HISTORY — DX: Polyneuropathy, unspecified: G62.9

## 2013-08-08 HISTORY — DX: Other chronic pain: G89.29

## 2013-08-08 HISTORY — DX: Type 2 diabetes mellitus without complications: E11.9

## 2013-08-08 HISTORY — DX: Cervicalgia: M54.2

## 2013-08-08 HISTORY — DX: Essential (primary) hypertension: I10

## 2013-08-08 HISTORY — DX: Reserved for concepts with insufficient information to code with codable children: IMO0002

## 2013-08-08 HISTORY — DX: Carpal tunnel syndrome, unspecified upper limb: G56.00

## 2013-08-08 LAB — CBC WITH DIFFERENTIAL/PLATELET
Basophils Absolute: 0 10*3/uL (ref 0.0–0.1)
Basophils Relative: 0 % (ref 0–1)
Eosinophils Absolute: 0.3 10*3/uL (ref 0.0–0.7)
HCT: 46.3 % (ref 39.0–52.0)
Hemoglobin: 16.1 g/dL (ref 13.0–17.0)
MCH: 32.9 pg (ref 26.0–34.0)
MCHC: 34.8 g/dL (ref 30.0–36.0)
Monocytes Absolute: 1.1 10*3/uL — ABNORMAL HIGH (ref 0.1–1.0)
Monocytes Relative: 9 % (ref 3–12)
Neutro Abs: 5.9 10*3/uL (ref 1.7–7.7)
Neutrophils Relative %: 49 % (ref 43–77)
RDW: 12.9 % (ref 11.5–15.5)

## 2013-08-08 LAB — URINALYSIS W MICROSCOPIC + REFLEX CULTURE
Glucose, UA: NEGATIVE mg/dL
Leukocytes, UA: NEGATIVE
Nitrite: NEGATIVE
pH: 6.5 (ref 5.0–8.0)

## 2013-08-08 LAB — BASIC METABOLIC PANEL WITH GFR
BUN: 17 mg/dL (ref 6–23)
CO2: 27 meq/L (ref 19–32)
Calcium: 9.2 mg/dL (ref 8.4–10.5)
Chloride: 105 meq/L (ref 96–112)
Creatinine, Ser: 0.67 mg/dL (ref 0.50–1.35)
GFR calc Af Amer: >90 mL/min (ref >90)
GFR calc non Af Amer: >90 mL/min (ref >90)
Glucose, Bld: 174 mg/dL — ABNORMAL HIGH (ref 70–99)
Potassium: 3.8 meq/L (ref 3.5–5.1)
Sodium: 141 meq/L (ref 135–145)

## 2013-08-08 LAB — TROPONIN I: Troponin I: <0.3 ng/mL (ref <0.30)

## 2013-08-08 MED ORDER — ALBUTEROL SULFATE HFA 108 (90 BASE) MCG/ACT IN AERS: 2.0000 | INHALATION_SPRAY | RESPIRATORY_TRACT | Status: DC | PRN

## 2013-08-08 MED ORDER — IPRATROPIUM BROMIDE 0.02 % IN SOLN
0.5000 mg | Freq: Once | RESPIRATORY_TRACT | Status: AC
  Administered 2013-08-08: 0.5 mg via RESPIRATORY_TRACT
  Filled 2013-08-08: qty 2.5

## 2013-08-08 MED ORDER — ALBUTEROL SULFATE (5 MG/ML) 0.5% IN NEBU
5.0000 mg | INHALATION_SOLUTION | Freq: Once | RESPIRATORY_TRACT | Status: AC
  Administered 2013-08-08: 5 mg via RESPIRATORY_TRACT
  Filled 2013-08-08: qty 1

## 2013-08-08 NOTE — ED Notes (Signed)
Pt reports cp for 3 weeks, pain is constant, was seen at another local ed on Sunday and dx w/ bronchitis and is on meds for this.  Today was driving and had sudden onset of generalized cp. Denies any sob or cough. No fever, no nausea or vomiting. Pt lasted only seconds and has since gotten better. No radiation to jaw or arms. No back pain, stated he thought "coulda been my heartburn acting up".  Is pain free at present.

## 2013-08-08 NOTE — ED Provider Notes (Signed)
CSN: [id number]     Arrival date & time 08/08/13  1340 History   First MD Initiated Contact with Patient 08/08/13 1353     Chief Complaint  Patient presents with  . Chest Pain    HPI Pt was seen at 1415.  Per pt, c/o gradual onset and persistence of constant chest "pain" and cough for the past 4 weeks. Describes the chest pain as "sharp" and "dull." States he has been evaluated by his PMD x2, a local [facility], and [facility] for same. States he was dx with COPD and bronchitis, rx MDI and antibiotics. States "every time I went to see the doctor they gave me a steroid shot;" endorses 5 total "steroid shots." States he was driving today and felt a fleeting "sharp" pain in his anterior lower chest that lasted only seconds. Endorses the "dull" chest pain he has had constantly for the past 4 weeks continues. Denies N/V/D, no fevers, no back pain, no rash, no palpitations, no SOB, no black or blood in stools.      Past Medical History  Diagnosis Date  . Hypertension   . Diabetes mellitus without complication   . High cholesterol   . COPD (chronic obstructive pulmonary disease)   . Hx of echocardiogram 11/2009    normal   . Sleep apnea   . GERD (gastroesophageal reflux disease)   . DDD (degenerative disc disease)   . Neuropathy   . Chronic sinusitis   . Carpal tunnel syndrome     bilateral  . Chronic neck pain   . Chronic back pain    Past Surgical History  Procedure Laterality Date  . Hand surgery    . Shoulder surgery      History  Substance Use Topics  . Smoking status: Current Every Day Smoker -- 1.00 packs/day    Types: Cigarettes  . Smokeless tobacco: Not on file  . Alcohol Use: No    Review of Systems ROS: Statement: All systems negative except as marked or noted in the HPI; Constitutional: Negative for fever and chills. ; ; Eyes: Negative for eye pain, redness and discharge. ; ; ENMT: Negative for ear pain, hoarseness, nasal congestion, sinus pressure and sore throat. ; ;  Cardiovascular: +CP. Negative for palpitations, diaphoresis, dyspnea and peripheral edema. ; ; Respiratory: +cough. Negative for wheezing and stridor. ; ; Gastrointestinal: Negative for nausea, vomiting, diarrhea, abdominal pain, blood in stool, hematemesis, jaundice and rectal bleeding. . ; ; Genitourinary: Negative for dysuria, flank pain and hematuria. ; ; Musculoskeletal: Negative for back pain and neck pain. Negative for swelling and trauma.; ; Skin: Negative for pruritus, rash, abrasions, blisters, bruising and skin lesion.; ; Neuro: Negative for headache, lightheadedness and neck stiffness. Negative for weakness, altered level of consciousness , altered mental status, extremity weakness, paresthesias, involuntary movement, seizure and syncope.       Allergies  Aspirin  Home Medications   Current Outpatient Rx  Name  Route  Sig  Dispense  Refill  . ALPRAZolam (XANAX) 1 MG tablet   Oral   Take 1 mg by mouth daily.         Marland Kitchen aspirin EC 81 MG tablet   Oral   Take 81 mg by mouth daily.         . CRESTOR 10 MG tablet   Oral   Take 10 mg by mouth daily.         Marland Kitchen lisinopril (PRINIVIL,ZESTRIL) 10 MG tablet   Oral   Take  10 mg by mouth daily.         . metFORMIN (GLUCOPHAGE) 500 MG tablet   Oral   Take 1,000 mg by mouth 2 (two) times daily.         . Omega-3 Fatty Acids (FISH OIL PO)   Oral   Take 1 capsule by mouth daily.         Marland Kitchen PROAIR HFA 108 (90 BASE) MCG/ACT inhaler   Inhalation   Inhale 2 puffs into the lungs every 6 (six) hours as needed.          BP 156/87  Pulse 85  Temp(Src) 98.3 F (36.8 C)  Resp 18  Ht 6\' 3"  (1.905 m)  Wt 220 lb (99.791 kg)  BMI 27.50 kg/m2  SpO2 97% Physical Exam 1420: Physical examination:  Nursing notes reviewed; Vital signs and O2 SAT reviewed;  Constitutional: Well developed, Well nourished, Well hydrated, In no acute distress; Head:  Normocephalic, atraumatic; Eyes: EOMI, PERRL, No scleral icterus; ENMT: Mouth and  pharynx normal, Mucous membranes moist; Neck: Supple, Full range of motion, No lymphadenopathy; Cardiovascular: Regular rate and rhythm, No gallop; Respiratory: Breath sounds diminished & equal bilaterally, No wheezes.  Speaking full sentences with ease, Normal respiratory effort/excursion; Chest: Nontender, Movement normal. No rash.; Abdomen: Soft, Nontender, Nondistended, Normal bowel sounds; Genitourinary: No CVA tenderness; Extremities: Pulses normal, No tenderness, No edema, No calf edema or asymmetry.; Neuro: AA&Ox3, Major CN grossly intact.  Speech clear. No gross focal motor or sensory deficits in extremities.; Skin: Color normal, Warm, Dry.   ED Course  Procedures   1600: [facility] ED records from 07/30/13 received: cardiac workup negative, tx neb, steroid, abx, d/c with dx exacerbation COPD.  1700:  Pt continues NAD, lungs CTA bilat, no wheezing, resps easy, speaking full sentences, Sats 97% R/A.  Pt ambulated around the ED with Sats remaining 96-97% R/A, resps easy, NAD.  Pt has gotten himself dressed and wants to go home now. Requesting refill of his albuterol MDI. Doubt PE as cause for symptoms with normal d-dimer and low risk Wells.  Doubt ACS as cause for symptoms with normal troponin and unchanged EKG from previous after 4 weeks of constant symptoms. Dx and testing d/w pt and family.  Questions answered.  Verb understanding, agreeable to d/c home with outpt f/u.     EKG Interpretation    Date/Time:  Wednesday August 08 2013 13:41:36 EST Ventricular Rate:  87 PR Interval:  164 QRS Duration: 96 QT Interval:  370 QTC Calculation: 445 R Axis:   63 Text Interpretation:  Normal sinus rhythm Normal ECG When compared with ECG of 16-Nov-2009 20:49, No significant change was found Confirmed by [facility]  MD, Nicholos Johns [phone]) on 08/08/2013 2:28:54 PM            MDM  MDM Reviewed: previous chart, nursing note and vitals Reviewed previous: labs and ECG Interpretation: labs, ECG  and x-ray   Results for orders placed during the hospital encounter of 08/08/13  URINALYSIS W MICROSCOPIC + REFLEX CULTURE      Result Value Range   Color, Urine YELLOW  YELLOW   APPearance HAZY (*) CLEAR   Specific Gravity, Urine 1.020  1.005 - 1.030   pH 6.5  5.0 - 8.0   Glucose, UA NEGATIVE  NEGATIVE mg/dL   Hgb urine dipstick NEGATIVE  NEGATIVE   Bilirubin Urine NEGATIVE  NEGATIVE   Ketones, ur NEGATIVE  NEGATIVE mg/dL   Protein, ur NEGATIVE  NEGATIVE mg/dL   Urobilinogen,  UA 0.2  0.0 - 1.0 mg/dL   Nitrite NEGATIVE  NEGATIVE   Leukocytes, UA NEGATIVE  NEGATIVE   WBC, UA 0-2  <3 WBC/hpf   Bacteria, UA FEW (*) RARE   Squamous Epithelial / LPF RARE  RARE   Urine-Other AMORPHOUS URATES/PHOSPHATES    CBC WITH DIFFERENTIAL      Result Value Range   WBC 12.0 (*) 4.0 - 10.5 K/uL   RBC 4.90  4.22 - 5.81 MIL/uL   Hemoglobin 16.1  13.0 - 17.0 g/dL   HCT 16.1  09.6 - 04.5 %   MCV 94.5  78.0 - 100.0 fL   MCH 32.9  26.0 - 34.0 pg   MCHC 34.8  30.0 - 36.0 g/dL   RDW 40.9  81.1 - 91.4 %   Platelets 298  150 - 400 K/uL   Neutrophils Relative % 49  43 - 77 %   Neutro Abs 5.9  1.7 - 7.7 K/uL   Lymphocytes Relative 40  12 - 46 %   Lymphs Abs 4.8 (*) 0.7 - 4.0 K/uL   Monocytes Relative 9  3 - 12 %   Monocytes Absolute 1.1 (*) 0.1 - 1.0 K/uL   Eosinophils Relative 3  0 - 5 %   Eosinophils Absolute 0.3  0.0 - 0.7 K/uL   Basophils Relative 0  0 - 1 %   Basophils Absolute 0.0  0.0 - 0.1 K/uL  BASIC METABOLIC PANEL      Result Value Range   Sodium 141  135 - 145 mEq/L   Potassium 3.8  3.5 - 5.1 mEq/L   Chloride 105  96 - 112 mEq/L   CO2 27  19 - 32 mEq/L   Glucose, Bld 174 (*) 70 - 99 mg/dL   BUN 17  6 - 23 mg/dL   Creatinine, Ser 7.82  0.50 - 1.35 mg/dL   Calcium 9.2  8.4 - 95.6 mg/dL   GFR calc non Af Amer >90  >90 mL/min   GFR calc Af Amer >90  >90 mL/min  TROPONIN I      Result Value Range   Troponin I <0.30  <0.30 ng/mL  D-DIMER, QUANTITATIVE      Result Value Range    D-Dimer, Quant 0.31  0.00 - 0.48 ug/mL-FEU   Dg Chest 2 View 08/08/2013   CLINICAL DATA:  Chest pain and COPD  EXAM: CHEST  2 VIEW  COMPARISON:  07/31/2013  FINDINGS: COPD with prominent lung markings. Negative for pneumonia. Negative for heart failure or effusion. No mass lesion.  IMPRESSION: Chronic lung disease.  No superimposed acute abnormality.   Electronically Signed   By: Marlan Palau M.D.   On: 08/08/2013 15:50         Laray Anger, DO 08/11/13 1435

## 2013-08-08 NOTE — ED Notes (Addendum)
Pt c/o cp x 3 weeks. Pt seen in ED in [place] last Sunday and dx with bronchitis. Pt c/o sudden episode sharp cp today pta.

## 2014-11-15 ENCOUNTER — Emergency Department (HOSPITAL_COMMUNITY)
Admission: EM | Admit: 2014-11-15 | Discharge: 2014-11-16 | Disposition: A | Discharge status: Home / Self Care | Payer: Medicare Other | Attending: Emergency Medicine | Admitting: Emergency Medicine

## 2014-11-15 ENCOUNTER — Emergency Department (HOSPITAL_COMMUNITY): Payer: Medicare Other

## 2014-11-15 ENCOUNTER — Encounter (HOSPITAL_COMMUNITY): Payer: Self-pay | Admitting: *Deleted

## 2014-11-15 DIAGNOSIS — G8929 Other chronic pain: Secondary | ICD-10-CM | POA: Diagnosis not present

## 2014-11-15 DIAGNOSIS — Z720 Tobacco use: Secondary | ICD-10-CM | POA: Insufficient documentation

## 2014-11-15 DIAGNOSIS — J441 Chronic obstructive pulmonary disease with (acute) exacerbation: Secondary | ICD-10-CM

## 2014-11-15 DIAGNOSIS — R05 Cough: Secondary | ICD-10-CM | POA: Diagnosis present

## 2014-11-15 DIAGNOSIS — E780 Pure hypercholesterolemia: Secondary | ICD-10-CM | POA: Diagnosis not present

## 2014-11-15 DIAGNOSIS — R112 Nausea with vomiting, unspecified: Secondary | ICD-10-CM | POA: Diagnosis not present

## 2014-11-15 DIAGNOSIS — E119 Type 2 diabetes mellitus without complications: Secondary | ICD-10-CM | POA: Diagnosis not present

## 2014-11-15 DIAGNOSIS — Z79899 Other long term (current) drug therapy: Secondary | ICD-10-CM | POA: Diagnosis not present

## 2014-11-15 DIAGNOSIS — I10 Essential (primary) hypertension: Secondary | ICD-10-CM | POA: Insufficient documentation

## 2014-11-15 DIAGNOSIS — Z7982 Long term (current) use of aspirin: Secondary | ICD-10-CM | POA: Diagnosis not present

## 2014-11-15 DIAGNOSIS — Z8719 Personal history of other diseases of the digestive system: Secondary | ICD-10-CM | POA: Insufficient documentation

## 2014-11-15 LAB — CBC
HCT: 45.8 % (ref 39.0–52.0)
HEMOGLOBIN: 15.1 g/dL (ref 13.0–17.0)
MCH: 29.9 pg (ref 26.0–34.0)
MCHC: 33 g/dL (ref 30.0–36.0)
MCV: 90.7 fL (ref 78.0–100.0)
Platelets: 279 10*3/uL (ref 150–400)
RBC: 5.05 MIL/uL (ref 4.22–5.81)
RDW: 14 % (ref 11.5–15.5)
WBC: 9.6 10*3/uL (ref 4.0–10.5)

## 2014-11-15 LAB — BASIC METABOLIC PANEL
Anion gap: 9 (ref 5–15)
BUN: 15 mg/dL (ref 6–23)
CHLORIDE: 104 mmol/L (ref 96–112)
CO2: 25 mmol/L (ref 19–32)
Calcium: 9.2 mg/dL (ref 8.4–10.5)
Creatinine, Ser: 0.86 mg/dL (ref 0.50–1.35)
GFR calc Af Amer: >90 mL/min (ref >90)
GFR calc non Af Amer: >90 mL/min (ref >90)
GLUCOSE: 116 mg/dL — AB (ref 70–99)
Potassium: 4.1 mmol/L (ref 3.5–5.1)
Sodium: 138 mmol/L (ref 135–145)

## 2014-11-15 MED ORDER — ALBUTEROL (5 MG/ML) CONTINUOUS INHALATION SOLN
15.0000 mg/h | INHALATION_SOLUTION | Freq: Once | RESPIRATORY_TRACT | Status: AC
  Administered 2014-11-15: 15 mg/h via RESPIRATORY_TRACT
  Filled 2014-11-15: qty 20

## 2014-11-15 MED ORDER — ONDANSETRON HCL 4 MG/2ML IJ SOLN
4.0000 mg | Freq: Once | INTRAMUSCULAR | Status: AC
  Administered 2014-11-16: 4 mg via INTRAVENOUS
  Filled 2014-11-15: qty 2

## 2014-11-15 MED ORDER — SODIUM CHLORIDE 0.9 % IV SOLN
1000.0000 mL | Freq: Once | INTRAVENOUS | Status: AC
  Administered 2014-11-16: 1000 mL via INTRAVENOUS

## 2014-11-15 MED ORDER — METHYLPREDNISOLONE SODIUM SUCC 125 MG IJ SOLR
125.0000 mg | Freq: Once | INTRAMUSCULAR | Status: AC
  Administered 2014-11-16: 125 mg via INTRAVENOUS
  Filled 2014-11-15: qty 2

## 2014-11-15 MED ORDER — IPRATROPIUM BROMIDE 0.02 % IN SOLN
0.5000 mg | Freq: Once | RESPIRATORY_TRACT | Status: AC
  Administered 2014-11-15: 0.5 mg via RESPIRATORY_TRACT
  Filled 2014-11-15: qty 2.5

## 2014-11-15 MED ORDER — SODIUM CHLORIDE 0.9 % IV SOLN: 1000.0000 mL | INTRAVENOUS | Status: DC

## 2014-11-15 MED ORDER — LEVOFLOXACIN IN D5W 750 MG/150ML IV SOLN
750.0000 mg | Freq: Once | INTRAVENOUS | Status: AC
Start: 2014-11-15 — End: 2014-11-16
  Administered 2014-11-16: 750 mg via INTRAVENOUS
  Filled 2014-11-15: qty 150

## 2014-11-15 NOTE — ED Notes (Signed)
Pt c/o productive cough, fever, and body aches x 1-2 days.
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
## 2014-11-16 DIAGNOSIS — J441 Chronic obstructive pulmonary disease with (acute) exacerbation: Secondary | ICD-10-CM | POA: Diagnosis not present

## 2014-11-16 MED ORDER — ALBUTEROL SULFATE HFA 108 (90 BASE) MCG/ACT IN AERS
2.0000 | INHALATION_SPRAY | RESPIRATORY_TRACT | Status: DC | PRN
  Administered 2014-11-16: 2 via RESPIRATORY_TRACT
  Filled 2014-11-16: qty 6.7

## 2014-11-16 MED ORDER — FENTANYL CITRATE 0.05 MG/ML IJ SOLN
50.0000 ug | Freq: Once | INTRAMUSCULAR | Status: AC
  Administered 2014-11-16: 50 ug via INTRAVENOUS
  Filled 2014-11-16: qty 2

## 2014-11-16 MED ORDER — ONDANSETRON HCL 4 MG PO TABS: 4.0000 mg | ORAL_TABLET | Freq: Three times a day (TID) | ORAL | Status: AC | PRN

## 2014-11-16 MED ORDER — AEROCHAMBER Z-STAT PLUS/MEDIUM MISC
1.0000 | Freq: Once | Status: AC
  Administered 2014-11-16: 1

## 2014-11-16 MED ORDER — ALBUTEROL SULFATE HFA 108 (90 BASE) MCG/ACT IN AERS: 2.0000 | INHALATION_SPRAY | RESPIRATORY_TRACT | Status: AC | PRN

## 2014-11-16 MED ORDER — PREDNISONE 20 MG PO TABS: ORAL_TABLET | ORAL | Status: AC

## 2014-11-16 MED ORDER — AZITHROMYCIN 250 MG PO TABS: ORAL_TABLET | ORAL | Status: AC
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
## 2014-11-16 NOTE — ED Notes (Signed)
Patient ambulated around nursing station with no difficulty. Patient denied shortness of breath. Oxygen saturation 96% on room air, heart rate 124.

## 2014-11-19 ENCOUNTER — Emergency Department (HOSPITAL_COMMUNITY): Payer: Medicare Other

## 2014-11-19 ENCOUNTER — Emergency Department (HOSPITAL_COMMUNITY)
Admission: EM | Admit: 2014-11-19 | Discharge: 2014-11-19 | Disposition: A | Discharge status: Home / Self Care | Payer: Medicare Other | Attending: Emergency Medicine | Admitting: Emergency Medicine

## 2014-11-19 ENCOUNTER — Encounter (HOSPITAL_COMMUNITY): Payer: Self-pay

## 2014-11-19 DIAGNOSIS — Z79899 Other long term (current) drug therapy: Secondary | ICD-10-CM | POA: Diagnosis not present

## 2014-11-19 DIAGNOSIS — Z7952 Long term (current) use of systemic steroids: Secondary | ICD-10-CM | POA: Insufficient documentation

## 2014-11-19 DIAGNOSIS — R0600 Dyspnea, unspecified: Secondary | ICD-10-CM

## 2014-11-19 DIAGNOSIS — E119 Type 2 diabetes mellitus without complications: Secondary | ICD-10-CM | POA: Diagnosis not present

## 2014-11-19 DIAGNOSIS — J441 Chronic obstructive pulmonary disease with (acute) exacerbation: Secondary | ICD-10-CM | POA: Diagnosis not present

## 2014-11-19 DIAGNOSIS — Z8669 Personal history of other diseases of the nervous system and sense organs: Secondary | ICD-10-CM | POA: Insufficient documentation

## 2014-11-19 DIAGNOSIS — Z7982 Long term (current) use of aspirin: Secondary | ICD-10-CM | POA: Insufficient documentation

## 2014-11-19 DIAGNOSIS — I10 Essential (primary) hypertension: Secondary | ICD-10-CM | POA: Insufficient documentation

## 2014-11-19 DIAGNOSIS — Z720 Tobacco use: Secondary | ICD-10-CM | POA: Diagnosis not present

## 2014-11-19 DIAGNOSIS — K219 Gastro-esophageal reflux disease without esophagitis: Secondary | ICD-10-CM | POA: Diagnosis not present

## 2014-11-19 DIAGNOSIS — R0602 Shortness of breath: Secondary | ICD-10-CM | POA: Diagnosis present

## 2014-11-19 MED ORDER — HYDROCOD POLST-CHLORPHEN POLST 10-8 MG/5ML PO LQCR: 5.0000 mL | Freq: Two times a day (BID) | ORAL | Status: AC | PRN

## 2014-11-19 MED ORDER — HYDROCODONE-ACETAMINOPHEN 5-325 MG PO TABS
1.0000 | ORAL_TABLET | Freq: Once | ORAL | Status: AC
  Administered 2014-11-19: 1 via ORAL
  Filled 2014-11-19: qty 1

## 2014-11-19 MED ORDER — ALBUTEROL SULFATE (2.5 MG/3ML) 0.083% IN NEBU
5.0000 mg | INHALATION_SOLUTION | Freq: Once | RESPIRATORY_TRACT | Status: AC
  Administered 2014-11-19: 5 mg via RESPIRATORY_TRACT
  Filled 2014-11-19: qty 6
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
## 2014-11-19 NOTE — Discharge Instructions (Signed)
As discussed, your evaluation today has been largely reassuring.  But, it is important that you monitor your condition carefully, and do not hesitate to return to the ED if you develop new, or concerning changes in your condition. ? ?Otherwise, please follow-up with your physician for appropriate ongoing care. ? ?
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
## 2015-01-09 ENCOUNTER — Other Ambulatory Visit (HOSPITAL_COMMUNITY): Payer: Self-pay | Admitting: Internal Medicine

## 2015-01-09 DIAGNOSIS — C169 Malignant neoplasm of stomach, unspecified: Secondary | ICD-10-CM

## 2015-01-15 ENCOUNTER — Ambulatory Visit (HOSPITAL_COMMUNITY)
Admission: RE | Admit: 2015-01-15 | Discharge: 2015-01-15 | Disposition: A | Discharge status: Home / Self Care | Payer: Medicare Other | Source: Ambulatory Visit | Attending: Internal Medicine | Admitting: Internal Medicine

## 2015-01-15 DIAGNOSIS — C169 Malignant neoplasm of stomach, unspecified: Secondary | ICD-10-CM

## 2015-01-15 DIAGNOSIS — Z79899 Other long term (current) drug therapy: Secondary | ICD-10-CM | POA: Insufficient documentation

## 2015-01-15 LAB — GLUCOSE, CAPILLARY: GLUCOSE-CAPILLARY: 86 mg/dL (ref 70–99)

## 2015-01-15 MED ORDER — FLUDEOXYGLUCOSE F - 18 (FDG) INJECTION
9.8000 | Freq: Once | INTRAVENOUS | Status: AC | PRN
  Administered 2015-01-15: 9.8 via INTRAVENOUS

## 2015-03-25 ENCOUNTER — Other Ambulatory Visit (HOSPITAL_COMMUNITY): Payer: Self-pay | Admitting: Oncology

## 2015-03-25 DIAGNOSIS — C169 Malignant neoplasm of stomach, unspecified: Secondary | ICD-10-CM

## 2015-03-27 ENCOUNTER — Encounter (HOSPITAL_COMMUNITY)
Admission: RE | Admit: 2015-03-27 | Discharge: 2015-03-27 | Disposition: A | Discharge status: Home / Self Care | Payer: Medicare Other | Source: Ambulatory Visit | Attending: Oncology | Admitting: Oncology

## 2015-03-27 ENCOUNTER — Encounter (HOSPITAL_COMMUNITY): Payer: Self-pay

## 2015-03-27 DIAGNOSIS — C169 Malignant neoplasm of stomach, unspecified: Secondary | ICD-10-CM

## 2015-03-27 DIAGNOSIS — I2510 Atherosclerotic heart disease of native coronary artery without angina pectoris: Secondary | ICD-10-CM | POA: Insufficient documentation

## 2015-03-27 DIAGNOSIS — C772 Secondary and unspecified malignant neoplasm of intra-abdominal lymph nodes: Secondary | ICD-10-CM | POA: Insufficient documentation

## 2015-03-27 DIAGNOSIS — J439 Emphysema, unspecified: Secondary | ICD-10-CM | POA: Insufficient documentation

## 2015-03-27 DIAGNOSIS — N200 Calculus of kidney: Secondary | ICD-10-CM | POA: Diagnosis not present

## 2015-03-27 LAB — GLUCOSE, CAPILLARY: GLUCOSE-CAPILLARY: 106 mg/dL — AB (ref 65–99)

## 2015-03-27 MED ORDER — FLUDEOXYGLUCOSE F - 18 (FDG) INJECTION
8.8500 | Freq: Once | INTRAVENOUS | Status: AC | PRN
  Administered 2015-03-27: 8.85 via INTRAVENOUS

## 2015-04-01 ENCOUNTER — Other Ambulatory Visit: Payer: Self-pay | Admitting: Radiation Oncology

## 2015-04-01 ENCOUNTER — Ambulatory Visit (HOSPITAL_COMMUNITY): Payer: Medicare Other

## 2015-06-18 ENCOUNTER — Other Ambulatory Visit (HOSPITAL_COMMUNITY): Payer: Self-pay | Admitting: Oncology

## 2015-06-18 DIAGNOSIS — C169 Malignant neoplasm of stomach, unspecified: Secondary | ICD-10-CM

## 2015-06-24 ENCOUNTER — Encounter (HOSPITAL_COMMUNITY)
Admission: RE | Admit: 2015-06-24 | Discharge: 2015-06-24 | Disposition: A | Discharge status: Home / Self Care | Payer: Medicare Other | Source: Ambulatory Visit | Attending: Oncology | Admitting: Oncology

## 2015-06-24 DIAGNOSIS — R918 Other nonspecific abnormal finding of lung field: Secondary | ICD-10-CM | POA: Insufficient documentation

## 2015-06-24 DIAGNOSIS — Z79899 Other long term (current) drug therapy: Secondary | ICD-10-CM | POA: Insufficient documentation

## 2015-06-24 DIAGNOSIS — C169 Malignant neoplasm of stomach, unspecified: Secondary | ICD-10-CM | POA: Diagnosis present

## 2015-06-24 DIAGNOSIS — R591 Generalized enlarged lymph nodes: Secondary | ICD-10-CM | POA: Diagnosis not present

## 2015-06-24 LAB — GLUCOSE, CAPILLARY: Glucose-Capillary: 114 mg/dL — ABNORMAL HIGH (ref 65–99)

## 2015-06-24 MED ORDER — FLUDEOXYGLUCOSE F - 18 (FDG) INJECTION
8.6000 | Freq: Once | INTRAVENOUS | Status: DC | PRN
  Administered 2015-06-24: 8.6 via INTRAVENOUS
  Filled 2015-06-24: qty 8.6

## 2015-06-27 ENCOUNTER — Ambulatory Visit (HOSPITAL_COMMUNITY): Payer: Medicare Other

## 2015-09-07 DEATH — deceased
